# Patient Record
Sex: Female | Born: 1995 | Race: White | Hispanic: No | Marital: Single | Smoking: Never smoker
Health system: Southern US, Community
[De-identification: ages and names within clinical notes are randomized; demographics above are authoritative.]

---

## 2017-03-14 ENCOUNTER — Ambulatory Visit (INDEPENDENT_AMBULATORY_CARE_PROVIDER_SITE_OTHER): Payer: 59

## 2017-03-14 ENCOUNTER — Ambulatory Visit (INDEPENDENT_AMBULATORY_CARE_PROVIDER_SITE_OTHER): Payer: 59 | Admitting: Podiatry

## 2017-03-14 ENCOUNTER — Encounter: Payer: Self-pay | Admitting: Podiatry

## 2017-03-14 VITALS — BP 139/94 | HR 64 | Resp 14 | Ht 67.0 in | Wt 125.0 lb

## 2017-03-14 DIAGNOSIS — S92301A Fracture of unspecified metatarsal bone(s), right foot, initial encounter for closed fracture: Secondary | ICD-10-CM

## 2017-03-14 DIAGNOSIS — M79671 Pain in right foot: Secondary | ICD-10-CM | POA: Diagnosis not present

## 2017-03-14 DIAGNOSIS — M779 Enthesopathy, unspecified: Secondary | ICD-10-CM

## 2017-03-14 NOTE — Patient Instructions (Signed)

## 2017-03-15 NOTE — Progress Notes (Signed)
Subjective:     Patient ID: Theresa Ramsey, female   DOB: 03-Feb-1996, 21 y.o.   MRN: [id number]  HPI patient presents with her mother stating she's had chronic pain in the outside of her right foot for at least a year and she's had a history of ankle sprains. Patient states she cannot do the types of activity she needs to and it throbs anytime she tries to be active and she's tried to change shoes ice the area and take oral anti-inflammatories   Review of Systems  All other systems reviewed and are negative.      Objective:   Physical Exam  Constitutional: She is oriented to person, place, and time.  Cardiovascular: Intact distal pulses.   Musculoskeletal: Normal range of motion.  Neurological: She is oriented to person, place, and time.  Skin: Skin is warm.  Nursing note and vitals reviewed.  neurovascular status intact muscle strength adequate range of motion within normal limits with patient found to have discomfort in the base of the fifth metatarsal right with inflammation redness and pain when palpated. It is localized to this area but is quite tender and does show multiple signs of inflammation     Assessment:     Patient has possible peroneal tendinitis or possible fracture secondary to previous injury    Plan:     H&P and x-ray reviewed with patient. There is a probable old fracture or possibly a nonossified growth plate of the right base of fifth metatarsal and it appears to be more of an avulsion injury versus a Jones fracture. Peroneal tendon is functioning but it's difficult to tell where it is attached. 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 They are not interested in anything conservatively and they want this fixed and since they need to get it done in the next several weeks I allowed her and her mother to review the consent form going over alternative treatments and complications. She  understands we may need to reattach the peroneal tendon that may not functioning as well postoperatively and all other complications as listed and the fact she will most likely be nonweightbearing for 2-4 weeks. Patient signed consent form is given all preoperative instructions and is dispensed air fracture walker and is encouraged to call with any questions  Multiple view x-rays of the right indicated that there is a fracture base of fifth metatarsal that is not attached

## 2017-04-02 ENCOUNTER — Encounter: Payer: Self-pay | Admitting: Podiatry

## 2017-04-02 DIAGNOSIS — S93401D Sprain of unspecified ligament of right ankle, subsequent encounter: Secondary | ICD-10-CM

## 2017-04-02 DIAGNOSIS — S92301P Fracture of unspecified metatarsal bone(s), right foot, subsequent encounter for fracture with malunion: Secondary | ICD-10-CM

## 2017-04-03 ENCOUNTER — Telehealth: Payer: Self-pay | Admitting: Podiatry

## 2017-04-03 NOTE — Telephone Encounter (Signed)
I spoke with pt's ftr, he states pt's mtr was calling concerning pt's pain and medications. Pt's ftr, states pt is waking up and says she is taking the pain medication about every 3 1/2 to 4 hours.I told him pt could take ibuprofen OTC 2 tablets every 4-6 hours between the dosing of the pain medication, and continue with the ice 15 minutes every hour awake. I spoke with the pt and she states she is having throbbing. I told pt to take off cam boot, open ended sock, ace wrap and leave the gauze in place, elevate the foot 15 minutes if the pain worsens elevated then dangle for 15 minutes. After 15 minutes place foot level with the hip and rewrap the ace beginning at the toes wrapping looser, replace the boot. I told pt to call with concerns.

## 2017-04-03 NOTE — Telephone Encounter (Signed)
Patient's mom [facility] called with a few questions in regards to medication prescribed for her surgery yesterday 05/08. Stated that Dr. Charlsie Merlesegal said she could get Ibuprofen and they do not want to go over the dosage amount. Also has questions about day after follow up care. [phone] or Mobile [phone].

## 2017-04-08 ENCOUNTER — Telehealth: Payer: Self-pay | Admitting: *Deleted

## 2017-04-08 NOTE — Telephone Encounter (Addendum)
Pt's mtr, Clydie BraunKaren states pt talked to Dr. Charlsie Merlesegal this weekend and requested lower dose of the pain medication. Left message for pt to call with the dose of the pain medication she is taking. I spoke with Clydie BraunKaren and she states pt is taking vicodin, I told her I would need the dose from the prescription. 04/08/2017-Pt called states she missed my call. 04/09/2017-I spoke with pt, she states she is taking Hydrocodone 10/325mg  one tablet every 4-6 hours prn foot pain. Pt states she spoke with Dr. Charlsie Merlesegal this weekend and he said he was out-of-town, but to call the office on Monday and get rx for lesser strength Hydrocodone. I told pt I would get order from Dr. Charlsie Merlesegal and she would need to have someone [place] the [place]. Dr. Charlsie Merlesegal ordered Hydrocodone 5/325mg  #30 one tablet every 4-6 hours prn foot pain.

## 2017-04-09 MED ORDER — HYDROCODONE-ACETAMINOPHEN 5-325 MG PO TABS: ORAL_TABLET | ORAL | 0 refills | Status: AC

## 2017-04-10 ENCOUNTER — Encounter: Payer: Self-pay | Admitting: Podiatry

## 2017-04-10 ENCOUNTER — Ambulatory Visit (INDEPENDENT_AMBULATORY_CARE_PROVIDER_SITE_OTHER): Payer: 59 | Admitting: Podiatry

## 2017-04-10 ENCOUNTER — Ambulatory Visit (INDEPENDENT_AMBULATORY_CARE_PROVIDER_SITE_OTHER): Payer: 59

## 2017-04-10 VITALS — BP 107/78 | HR 63 | Temp 98.4°F | Resp 16

## 2017-04-10 DIAGNOSIS — S92301A Fracture of unspecified metatarsal bone(s), right foot, initial encounter for closed fracture: Secondary | ICD-10-CM

## 2017-04-10 DIAGNOSIS — M779 Enthesopathy, unspecified: Secondary | ICD-10-CM

## 2017-04-15 NOTE — Progress Notes (Signed)
Subjective:    Patient ID: Theresa Ramsey, female   DOB: 21 y.o.   MRN: [id number]   HPI patient states she's doing very well with minimal discomfort    ROS      Objective:  Physical Exam neurovascular status intact with patient's lateral right foot having incision that's healing well with wound edges coapted well minimal edema noted and a much reduced deformity noted     Assessment:    Doing well post removal of fracture from the fifth metatarsal right with repair the peroneus longus tendon     Plan:    Advised on continued nonweightbearing and continue crutch usage and reviewed x-rays with patient and reappoint 2 weeks or earlier if needed  X-rays indicate satisfactory removal of fracture with a small little bone left and the area that I do not believe will be contributory to any type of problems

## 2017-04-24 ENCOUNTER — Ambulatory Visit (INDEPENDENT_AMBULATORY_CARE_PROVIDER_SITE_OTHER): Payer: 59

## 2017-04-24 ENCOUNTER — Ambulatory Visit (INDEPENDENT_AMBULATORY_CARE_PROVIDER_SITE_OTHER): Payer: 59 | Admitting: Podiatry

## 2017-04-24 DIAGNOSIS — M779 Enthesopathy, unspecified: Secondary | ICD-10-CM

## 2017-04-24 DIAGNOSIS — S92301A Fracture of unspecified metatarsal bone(s), right foot, initial encounter for closed fracture: Secondary | ICD-10-CM

## 2017-04-24 NOTE — Progress Notes (Signed)
Subjective:    Patient ID: Theresa Ramsey, female   DOB: 21 y.o.   MRN: [id number]   HPI patient presents stating she's doing quite a bit better with minimal discomfort    ROS      Objective:  Physical Exam neurovascular status intact negative Homans sign noted with wound edges coapted well right lateral foot with no active drainage noted no erythema edema and minimal inflammation around the bone structure     Assessment:    Doing well post removal of bone fragment fracture and repair of peroneal tendon     Plan:    Advised on continued compression continued boot usage and gradual weightbearing over the next several weeks and will speak seen back in the next 2-3 weeks or earlier if any issues should occur  X-rays indicate satisfactory removal of bone with no pathology

## 2017-04-25 ENCOUNTER — Ambulatory Visit: Payer: 59

## 2017-05-20 ENCOUNTER — Ambulatory Visit (INDEPENDENT_AMBULATORY_CARE_PROVIDER_SITE_OTHER): Payer: 59

## 2017-05-20 ENCOUNTER — Ambulatory Visit (INDEPENDENT_AMBULATORY_CARE_PROVIDER_SITE_OTHER): Payer: 59 | Admitting: Podiatry

## 2017-05-20 DIAGNOSIS — M779 Enthesopathy, unspecified: Secondary | ICD-10-CM

## 2017-05-20 DIAGNOSIS — S92301A Fracture of unspecified metatarsal bone(s), right foot, initial encounter for closed fracture: Secondary | ICD-10-CM

## 2017-09-11 NOTE — Progress Notes (Signed)
HPI patient presents stating she is continuing to improve   ROS      Objective:  Physical Exam neurovascular status intact negative Homans sign noted with wound edges coapted well right lateral foot with no active drainage noted no erythema edema and minimal inflammation around the bone structure     Assessment:    Doing well post removal of bone fragment fracture and repair of peroneal tendon     Plan:    Advised on continued compression continued boot usage and gradual weightbearing over the next several weeks and will speak seen back in the next 2-3 weeks or earlier if any issues should occur  X-rays indicate satisfactory removal of bone with no pathology

## 2021-05-22 ENCOUNTER — Other Ambulatory Visit: Payer: Self-pay

## 2021-05-22 ENCOUNTER — Other Ambulatory Visit: Payer: Self-pay | Admitting: Family Medicine

## 2021-05-22 ENCOUNTER — Ambulatory Visit
Admission: RE | Admit: 2021-05-22 | Discharge: 2021-05-22 | Disposition: A | Discharge status: Home / Self Care | Payer: BC Managed Care – PPO | Source: Ambulatory Visit | Attending: Family Medicine | Admitting: Family Medicine

## 2021-05-22 DIAGNOSIS — R52 Pain, unspecified: Secondary | ICD-10-CM

## 2022-10-30 IMAGING — CR DG FOOT COMPLETE 3+V*R*
3 series · 3 of 3 positions shown · non-contrast
Comparison: 03/14/2017

CLINICAL DATA: Lateral foot pain.  History of fracture

EXAM:
RIGHT FOOT COMPLETE - 3+ VIEW

[x foot ap right]
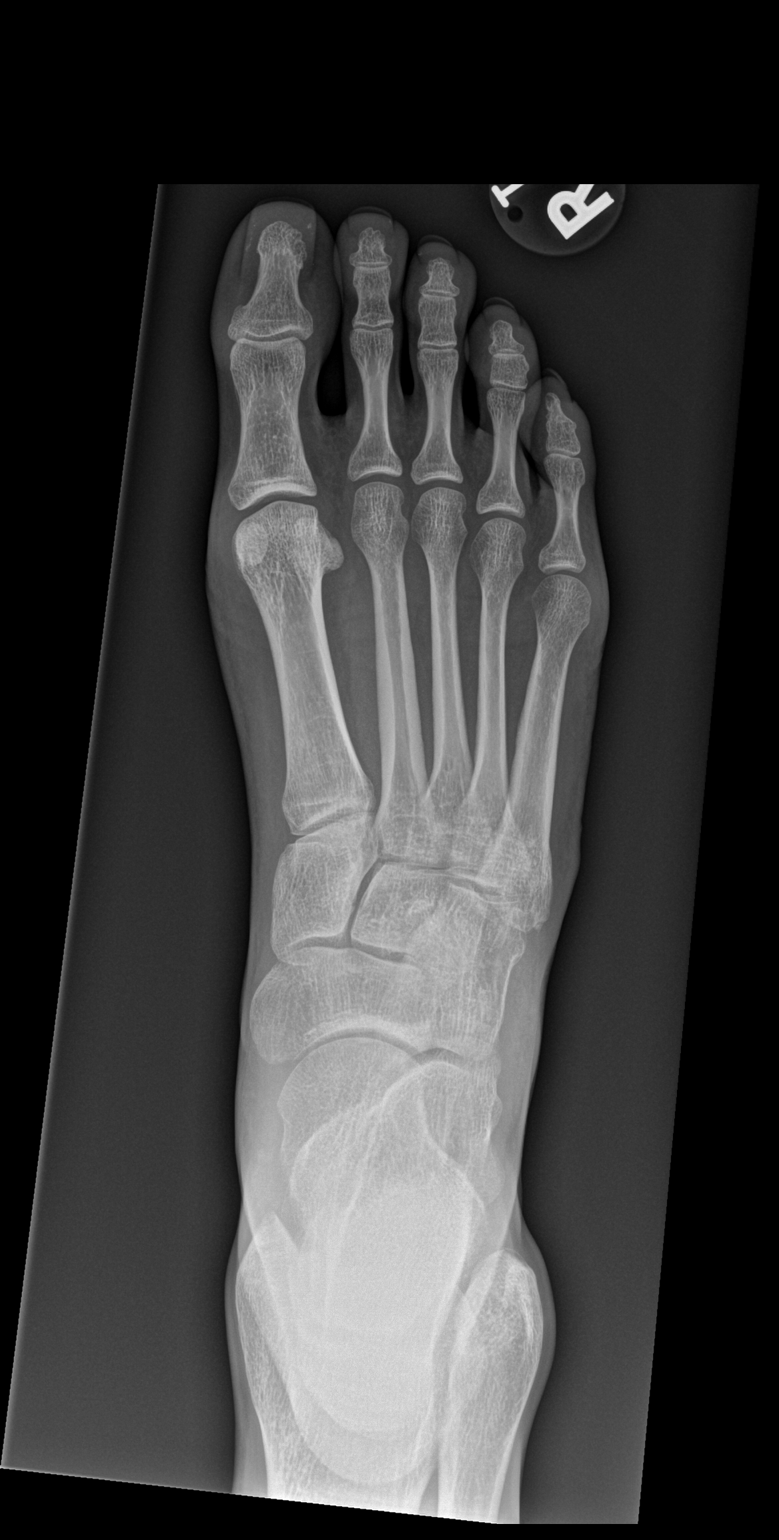

[x foot obl right]
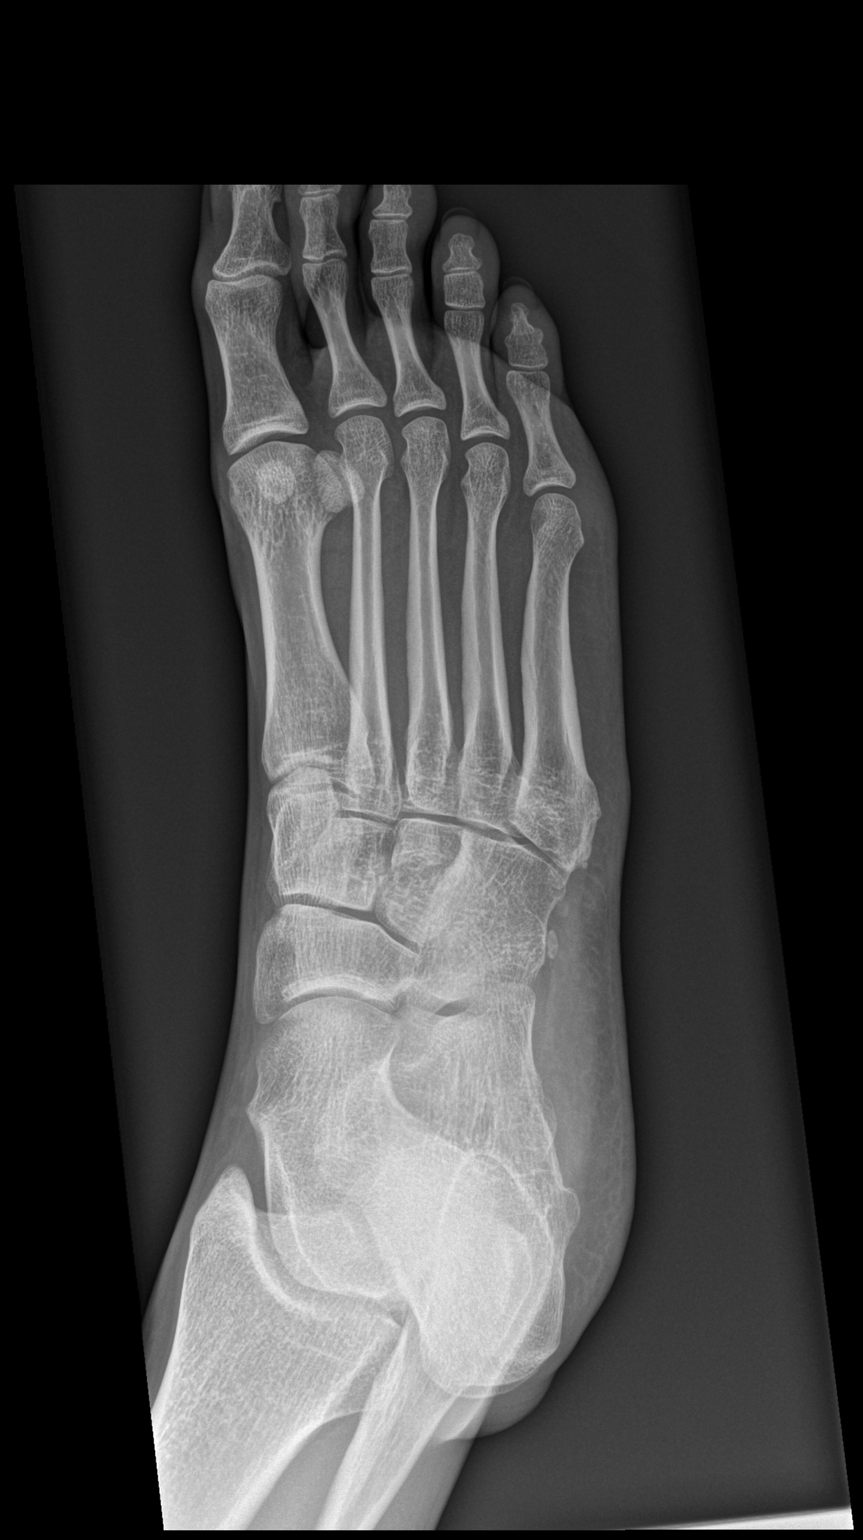

[x foot lat right]
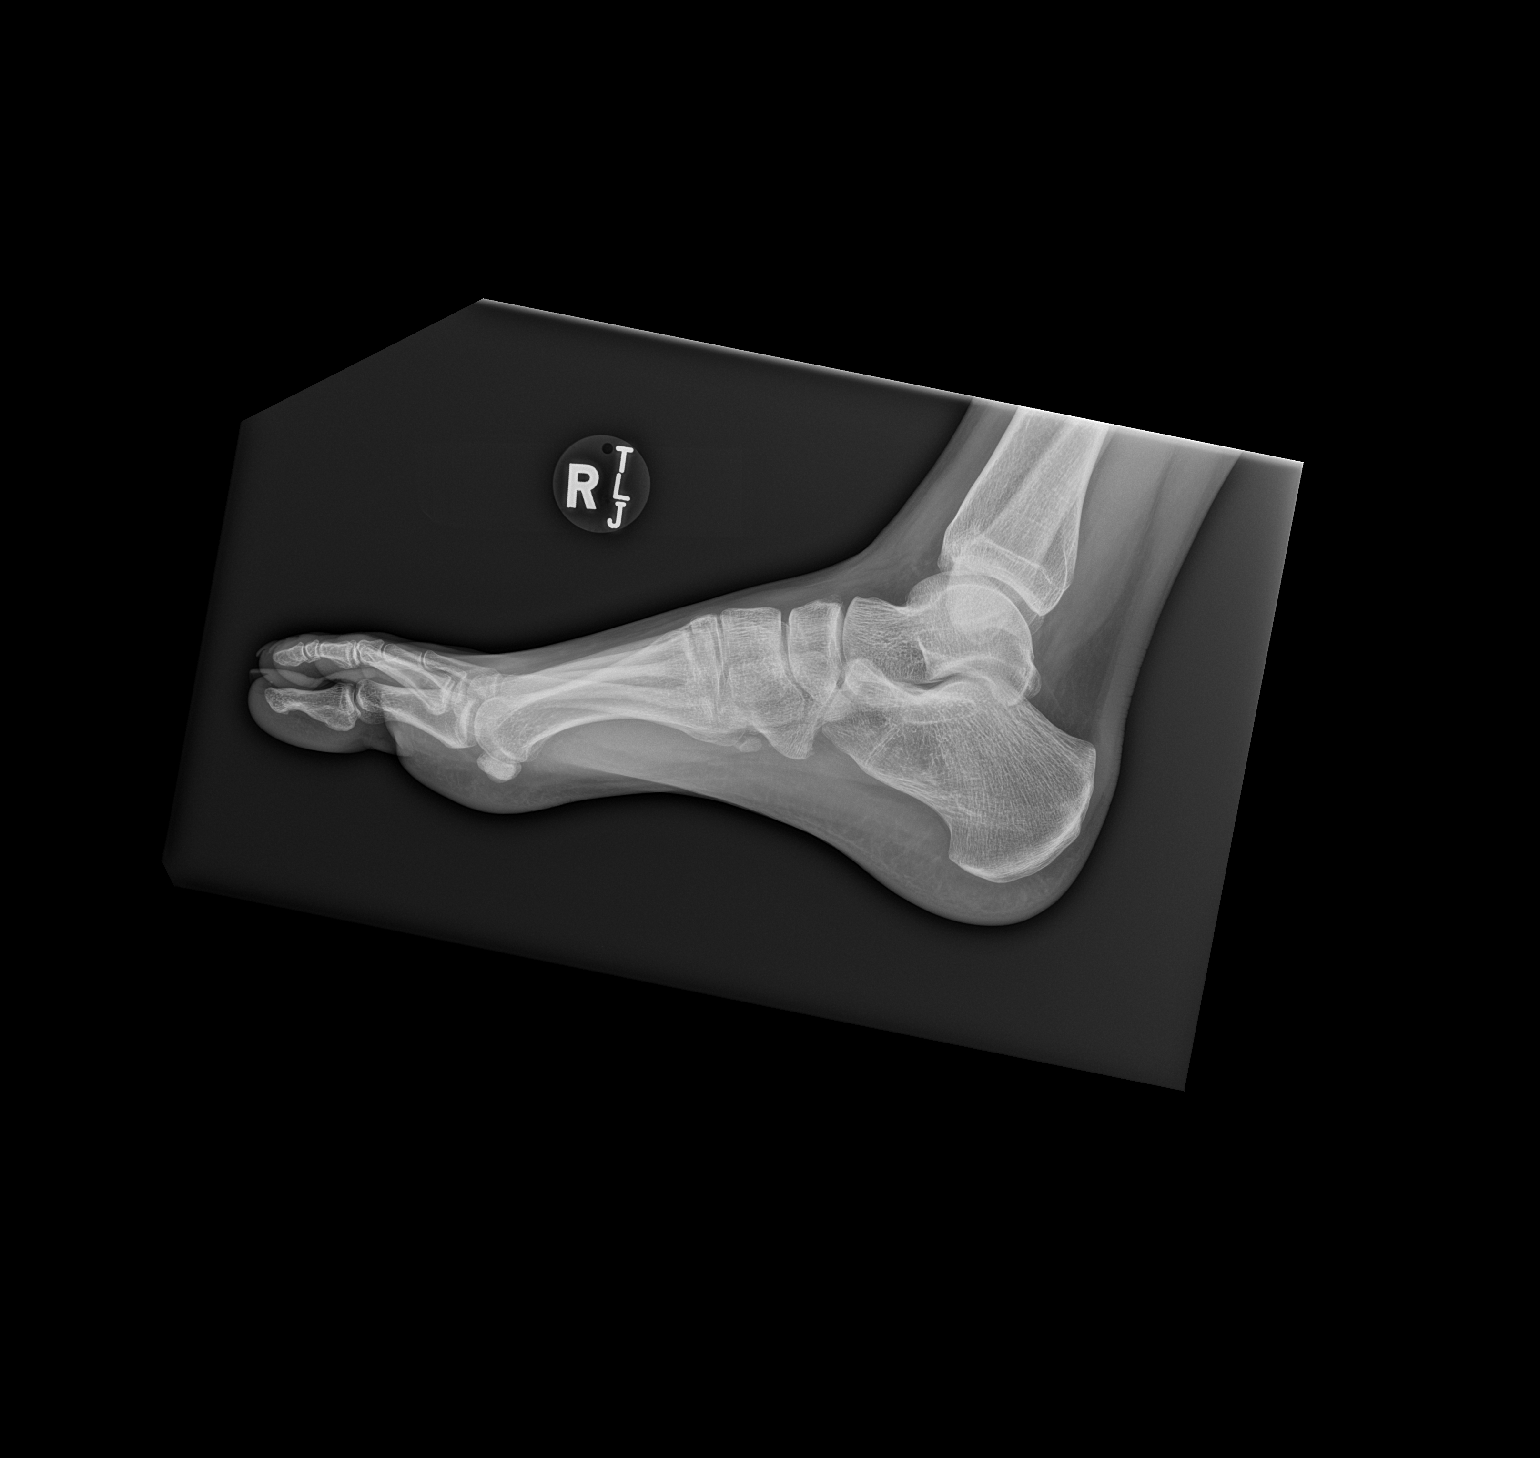

[3 of 3 positions shown; findings below may reference images not displayed]

FINDINGS: Healed fracture of the base of the fifth metatarsal. No new
fracture. No significant arthropathy.
IMPRESSION: No acute abnormality.  Healed fracture base of fifth metatarsal.
